# Patient Record
Sex: Female | Born: 1990 | Race: Black or African American | Hispanic: No | Marital: Single | Smoking: Current every day smoker
Health system: Southern US, Community
[De-identification: ages and names within clinical notes are randomized; demographics above are authoritative.]

---
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
## 2010-01-20 ENCOUNTER — Inpatient Hospital Stay (HOSPITAL_COMMUNITY): Admission: AD | Admit: 2010-01-20 | Discharge: 2010-01-20 | Payer: Self-pay | Admitting: Obstetrics

## 2010-04-06 ENCOUNTER — Inpatient Hospital Stay (HOSPITAL_COMMUNITY): Admission: AD | Admit: 2010-04-06 | Discharge: 2010-04-06 | Payer: Self-pay | Admitting: Obstetrics

## 2010-04-06 ENCOUNTER — Ambulatory Visit: Payer: Self-pay | Admitting: Obstetrics & Gynecology

## 2010-04-10 ENCOUNTER — Inpatient Hospital Stay (HOSPITAL_COMMUNITY): Admission: RE | Admit: 2010-04-10 | Discharge: 2010-04-12 | Payer: Self-pay | Admitting: Obstetrics

## 2010-12-11 ENCOUNTER — Inpatient Hospital Stay (HOSPITAL_COMMUNITY)
Admission: AD | Admit: 2010-12-11 | Discharge: 2010-12-12 | Discharge status: Against Medical Advice | Payer: Self-pay | Source: Ambulatory Visit | Attending: Obstetrics | Admitting: Obstetrics

## 2010-12-11 DIAGNOSIS — R109 Unspecified abdominal pain: Secondary | ICD-10-CM | POA: Insufficient documentation

## 2010-12-11 LAB — POCT PREGNANCY, URINE: Preg Test, Ur: NEGATIVE

## 2010-12-17 LAB — CBC
HCT: 25 % — ABNORMAL LOW (ref 36.0–46.0)
HCT: 33.4 % — ABNORMAL LOW (ref 36.0–46.0)
Hemoglobin: 11.5 g/dL — ABNORMAL LOW (ref 12.0–15.0)
Hemoglobin: 8.9 g/dL — ABNORMAL LOW (ref 12.0–15.0)
MCH: 30.1 pg (ref 26.0–34.0)
MCH: 30.9 pg (ref 26.0–34.0)
MCHC: 34.4 g/dL (ref 30.0–36.0)
MCHC: 35.5 g/dL (ref 30.0–36.0)
MCV: 87 fL (ref 78.0–100.0)
MCV: 87.6 fL (ref 78.0–100.0)
Platelets: 197 10*3/uL (ref 150–400)
Platelets: 311 10*3/uL (ref 150–400)
RBC: 2.88 MIL/uL — ABNORMAL LOW (ref 3.87–5.11)
RBC: 3.81 MIL/uL — ABNORMAL LOW (ref 3.87–5.11)
RDW: 18.5 % — ABNORMAL HIGH (ref 11.5–15.5)
RDW: 19 % — ABNORMAL HIGH (ref 11.5–15.5)
WBC: 12 10*3/uL — ABNORMAL HIGH (ref 4.0–10.5)
WBC: 14.4 10*3/uL — ABNORMAL HIGH (ref 4.0–10.5)

## 2010-12-17 LAB — URINALYSIS, ROUTINE W REFLEX MICROSCOPIC
Bilirubin Urine: NEGATIVE
Glucose, UA: NEGATIVE mg/dL
Hgb urine dipstick: NEGATIVE
Ketones, ur: NEGATIVE mg/dL
Nitrite: NEGATIVE
Protein, ur: NEGATIVE mg/dL
Specific Gravity, Urine: >1.03 — ABNORMAL HIGH (ref 1.005–1.030)
Urobilinogen, UA: 0.2 mg/dL (ref 0.0–1.0)
pH: 6 (ref 5.0–8.0)

## 2010-12-17 LAB — URINE CULTURE
Colony Count: NO GROWTH
Culture: NO GROWTH

## 2010-12-17 LAB — RPR: RPR Ser Ql: NONREACTIVE

## 2010-12-17 LAB — URINE MICROSCOPIC-ADD ON

## 2010-12-17 LAB — CCBB MATERNAL DONOR DRAW

## 2010-12-19 LAB — URINALYSIS, ROUTINE W REFLEX MICROSCOPIC
Bilirubin Urine: NEGATIVE
Glucose, UA: NEGATIVE mg/dL
Hgb urine dipstick: NEGATIVE
Ketones, ur: NEGATIVE mg/dL
Nitrite: NEGATIVE
Protein, ur: NEGATIVE mg/dL
Specific Gravity, Urine: >1.03 — ABNORMAL HIGH (ref 1.005–1.030)
Urobilinogen, UA: 0.2 mg/dL (ref 0.0–1.0)
pH: 6.5 (ref 5.0–8.0)

## 2010-12-20 LAB — URINE MICROSCOPIC-ADD ON

## 2010-12-20 LAB — URINALYSIS, ROUTINE W REFLEX MICROSCOPIC
Bilirubin Urine: NEGATIVE
Glucose, UA: NEGATIVE mg/dL
Hgb urine dipstick: NEGATIVE
Ketones, ur: NEGATIVE mg/dL
Nitrite: NEGATIVE
Protein, ur: NEGATIVE mg/dL
Specific Gravity, Urine: 1.025 (ref 1.005–1.030)
Urobilinogen, UA: 0.2 mg/dL (ref 0.0–1.0)
pH: 6.5 (ref 5.0–8.0)

## 2010-12-20 LAB — URINE CULTURE
Colony Count: NO GROWTH
Culture: NO GROWTH

## 2011-01-01 LAB — WET PREP, GENITAL
Clue Cells Wet Prep HPF POC: NONE SEEN
Trich, Wet Prep: NONE SEEN
Yeast Wet Prep HPF POC: NONE SEEN

## 2011-01-01 LAB — CBC
HCT: 35.6 % — ABNORMAL LOW (ref 36.0–46.0)
Hemoglobin: 12.6 g/dL (ref 12.0–15.0)
MCHC: 35.4 g/dL (ref 30.0–36.0)
MCV: 90.6 fL (ref 78.0–100.0)
Platelets: 259 10*3/uL (ref 150–400)
RBC: 3.93 MIL/uL (ref 3.87–5.11)
RDW: 16.4 % — ABNORMAL HIGH (ref 11.5–15.5)
WBC: 14.5 10*3/uL — ABNORMAL HIGH (ref 4.0–10.5)

## 2011-01-01 LAB — GC/CHLAMYDIA PROBE AMP, GENITAL
Chlamydia, DNA Probe: NEGATIVE
GC Probe Amp, Genital: NEGATIVE

## 2011-01-01 LAB — DIFFERENTIAL
Basophils Absolute: 0 10*3/uL (ref 0.0–0.1)
Basophils Relative: 0 % (ref 0–1)
Eosinophils Absolute: 0.6 10*3/uL (ref 0.0–0.7)
Eosinophils Relative: 4 % (ref 0–5)
Lymphocytes Relative: 11 % — ABNORMAL LOW (ref 12–46)
Lymphs Abs: 1.6 10*3/uL (ref 0.7–4.0)
Monocytes Absolute: 0.8 10*3/uL (ref 0.1–1.0)
Monocytes Relative: 6 % (ref 3–12)
Neutro Abs: 11.5 10*3/uL — ABNORMAL HIGH (ref 1.7–7.7)
Neutrophils Relative %: 79 % — ABNORMAL HIGH (ref 43–77)

## 2011-01-03 LAB — CBC
MCHC: 35.7 g/dL (ref 30.0–36.0)
MCV: 89.7 fL (ref 78.0–100.0)
RBC: 3.9 MIL/uL (ref 3.87–5.11)
RDW: 16.2 % — ABNORMAL HIGH (ref 11.5–15.5)

## 2011-01-03 LAB — URINALYSIS, ROUTINE W REFLEX MICROSCOPIC
Bilirubin Urine: NEGATIVE
Glucose, UA: NEGATIVE mg/dL
Ketones, ur: NEGATIVE mg/dL
Leukocytes, UA: NEGATIVE
Nitrite: NEGATIVE
Protein, ur: NEGATIVE mg/dL
Specific Gravity, Urine: 1.031 — ABNORMAL HIGH (ref 1.005–1.030)
Urobilinogen, UA: 1 mg/dL (ref 0.0–1.0)
pH: 6.5 (ref 5.0–8.0)

## 2011-01-03 LAB — POCT I-STAT, CHEM 8
BUN: 6 mg/dL (ref 6–23)
Creatinine, Ser: 0.6 mg/dL (ref 0.4–1.2)
Potassium: 3.7 mEq/L (ref 3.5–5.1)
Sodium: 139 mEq/L (ref 135–145)
TCO2: 22 mmol/L (ref 0–100)

## 2011-01-03 LAB — URINE MICROSCOPIC-ADD ON

## 2011-01-03 LAB — GC/CHLAMYDIA PROBE AMP, GENITAL: Chlamydia, DNA Probe: NEGATIVE

## 2011-01-03 LAB — POCT PREGNANCY, URINE: Preg Test, Ur: POSITIVE

## 2011-01-03 LAB — DIFFERENTIAL
Basophils Relative: 0 % (ref 0–1)
Eosinophils Absolute: 0.2 10*3/uL (ref 0.0–0.7)
Monocytes Relative: 5 % (ref 3–12)
Neutrophils Relative %: 76 % (ref 43–77)

## 2011-01-03 LAB — WET PREP, GENITAL: Yeast Wet Prep HPF POC: NONE SEEN

## 2011-01-16 LAB — URINALYSIS, ROUTINE W REFLEX MICROSCOPIC
Bilirubin Urine: NEGATIVE
Hgb urine dipstick: NEGATIVE
Nitrite: NEGATIVE
Protein, ur: NEGATIVE mg/dL
Specific Gravity, Urine: 1.027 (ref 1.005–1.030)
Urobilinogen, UA: 1 mg/dL (ref 0.0–1.0)
pH: 7 (ref 5.0–8.0)

## 2011-01-16 LAB — URINE MICROSCOPIC-ADD ON

## 2011-02-16 NOTE — Op Note (Signed)
   NAMESULEMA, Christine Horne                        ACCOUNT NO.:  [id number]   MEDICAL RECORD NO.:  [id number]                   PATIENT TYPE:  AMB   LOCATION:  DSC                                  FACILITY:  [facility]   PHYSICIAN:  Prabhakar D. Pendse, M.D.           DATE OF BIRTH:  05/01/1991   DATE OF PROCEDURE:  07/08/2003  DATE OF DISCHARGE:                                 OPERATIVE REPORT   PREOPERATIVE DIAGNOSIS:  1. Left inguinal hernia.  2. Possible right inguinal hernia.   POSTOPERATIVE DIAGNOSIS:  Bilateral indirect inguinal herniae.   OPERATION PERFORMED:  Repair of bilateral indirect inguinal herniae.   SURGEON:  Prabhakar D. Levie Heritage, M.D.   ASSISTANT:  Nurse   ANESTHESIA:  Nurse.   OPERATIVE PROCEDURE:  Under satisfactory general anesthesia, the patient in  supine position, the abdomen was sterilely prepped and draped in the usual  manner.  An about 5 cm long transverse incision was made in the distal skin  crease.  The skin and subcutaneous tissue were incised.  Bleeders were  individually clamped, cut, and electrocoagulated.  The external oblique was  opened.  The round ligament together with the hernia sac were isolated,  clamped, and doubly suture ligated with excess of the sac excised.  Hernia  repair was carried out with modified Ferguson method with 4-0 steel wire  interrupted sutures.  0.25% Marcaine with epinephrine was injected locally  for postop analgesia.  The subcutaneous tissue were opposed with 3-0 Vicryl  and the skin was closed with 5-0 Monocryl subcuticular sutures.  Since the  patient's general condition was satisfactory, exploration of the right groin  was carried out with findings consistent with right indirect inguinal  hernia.  Repair was carried out in a similar fashion.  Both incisions were  dressed with Steri-Strips.  Throughout the procedure, the patient's vital  signs remained stable.  The patient withstood the procedure well and was  transferred to the recovery room in satisfactory general condition.                                               Prabhakar D. Levie Heritage, M.D.    PDP/MEDQ  D:  07/08/2003  T:  07/08/2003  Job:  [id number]   cc:   Fanny Dance. Rankins, M.D.  [address]  [place]  [place] [address]  Fax: [phone]

## 2013-01-29 ENCOUNTER — Encounter (HOSPITAL_COMMUNITY): Payer: Self-pay | Admitting: Emergency Medicine

## 2013-01-29 ENCOUNTER — Emergency Department (HOSPITAL_COMMUNITY)
Admission: EM | Admit: 2013-01-29 | Discharge: 2013-01-29 | Disposition: A | Discharge status: Home / Self Care | Payer: Medicaid Other | Source: Home / Self Care | Attending: Family Medicine | Admitting: Family Medicine

## 2013-01-29 DIAGNOSIS — J309 Allergic rhinitis, unspecified: Secondary | ICD-10-CM

## 2013-01-29 LAB — POCT RAPID STREP A: Streptococcus, Group A Screen (Direct): NEGATIVE

## 2013-01-29 MED ORDER — LORATADINE 10 MG PO TABS: 10.0000 mg | ORAL_TABLET | Freq: Every day | ORAL | Status: DC

## 2013-01-29 NOTE — ED Notes (Signed)
Discharge delay secondary this nurse assignment/necessary resources

## 2013-01-29 NOTE — ED Provider Notes (Signed)
History     CSN: [id number]  Arrival date & time 01/29/13  1001   First MD Initiated Contact with Patient 01/29/13 1006      Chief Complaint  Patient presents with  . Sore Throat    (Consider location/radiation/quality/duration/timing/severity/associated sxs/prior treatment) HPI Comments: Pt is worried she has strep, wants to be tested. Denies exposure to strep  Patient is a 22 y.o. female presenting with pharyngitis. The history is provided by the patient.  Sore Throat This is a new problem. Episode onset: one week ago. The problem occurs constantly. The problem has not changed since onset.Pertinent negatives include no abdominal pain and no headaches. The symptoms are aggravated by swallowing. Nothing relieves the symptoms. She has tried nothing for the symptoms.    History reviewed. No pertinent past medical history.  Past Surgical History  Procedure Laterality Date  . Hernia repair  2007    No family history on file.  History  Substance Use Topics  . Smoking status: Current Every Day Smoker  . Smokeless tobacco: Not on file  . Alcohol Use: No    OB History   Grav Para Term Preterm Abortions TAB SAB Ect Mult Living                  Review of Systems  Constitutional: Negative for fever and chills.  HENT: Positive for sore throat. Negative for ear pain, congestion, rhinorrhea, trouble swallowing, postnasal drip and sinus pressure.   Respiratory: Negative for cough.   Gastrointestinal: Negative for abdominal pain.  Neurological: Negative for headaches.    Allergies  Review of patient's allergies indicates no known allergies.  Home Medications   Current Outpatient Rx  Name  Route  Sig  Dispense  Refill  . loratadine (CLARITIN) 10 MG tablet   Oral   Take 1 tablet (10 mg total) by mouth daily.   30 tablet   2     BP 115/91  Pulse 111  Temp(Src) 98 F (36.7 C) (Oral)  Resp 12  SpO2 97%  LMP 01/14/2013  Physical Exam  Constitutional: She appears  well-developed and well-nourished. She does not appear ill. No distress.  HENT:  Right Ear: Tympanic membrane, external ear and ear canal normal.  Left Ear: Tympanic membrane, external ear and ear canal normal.  Nose: Mucosal edema present. No rhinorrhea. Right sinus exhibits no maxillary sinus tenderness and no frontal sinus tenderness. Left sinus exhibits no maxillary sinus tenderness and no frontal sinus tenderness.  Mouth/Throat: Oropharynx is clear and moist and mucous membranes are normal.  Cardiovascular: Normal rate and regular rhythm.   Pulmonary/Chest: Effort normal and breath sounds normal.  Lymphadenopathy:       Head (right side): No submental, no submandibular and no tonsillar adenopathy present.       Head (left side): No submental, no submandibular and no tonsillar adenopathy present.    She has no cervical adenopathy.    ED Course  Procedures (including critical care time)  Labs Reviewed  POCT RAPID STREP A ([facility] CARE ONLY)   No results found.   1. Allergic rhinitis       MDM  Strep is negative, pt does not appear ill nor does she feel ill.  Most likely throat irritation from post nasal drainage; considering season will try tx with claritin 10mg  daily #30 2 refills        Cathlyn Parsons, NP 01/29/13 1151

## 2013-01-29 NOTE — ED Notes (Signed)
Patient not in treatment room 

## 2013-01-29 NOTE — ED Notes (Signed)
Reports sore throat since Thursday.  No one sick at home.  Denies fever.  Denies runny nose, denies cough, denies itchy eyes.

## 2013-01-29 NOTE — ED Provider Notes (Signed)
Medical screening examination/treatment/procedure(s) were performed by non-physician practitioner and as supervising physician I was immediately available for consultation/collaboration.   [facility]; MD  Sharin Grave, MD 01/29/13 [phone]

## 2018-10-28 ENCOUNTER — Emergency Department (HOSPITAL_COMMUNITY): Admission: EM | Admit: 2018-10-28 | Discharge: 2018-10-28 | Discharge status: Against Medical Advice | Payer: Self-pay

## 2018-10-29 ENCOUNTER — Encounter (HOSPITAL_COMMUNITY): Payer: Self-pay | Admitting: Emergency Medicine

## 2018-10-29 ENCOUNTER — Observation Stay (HOSPITAL_COMMUNITY): Payer: Medicaid Other

## 2018-10-29 ENCOUNTER — Observation Stay (HOSPITAL_COMMUNITY)
Admission: EM | Admit: 2018-10-29 | Discharge: 2018-10-30 | Disposition: A | Discharge status: Home / Self Care | Payer: Medicaid Other | Attending: Emergency Medicine | Admitting: Emergency Medicine

## 2018-10-29 ENCOUNTER — Other Ambulatory Visit: Payer: Self-pay

## 2018-10-29 DIAGNOSIS — T8149XA Infection following a procedure, other surgical site, initial encounter: Secondary | ICD-10-CM

## 2018-10-29 DIAGNOSIS — X58XXXA Exposure to other specified factors, initial encounter: Secondary | ICD-10-CM | POA: Diagnosis not present

## 2018-10-29 DIAGNOSIS — T8141XA Infection following a procedure, superficial incisional surgical site, initial encounter: Principal | ICD-10-CM | POA: Insufficient documentation

## 2018-10-29 DIAGNOSIS — F17200 Nicotine dependence, unspecified, uncomplicated: Secondary | ICD-10-CM | POA: Insufficient documentation

## 2018-10-29 DIAGNOSIS — T8140XA Infection following a procedure, unspecified, initial encounter: Secondary | ICD-10-CM | POA: Diagnosis present

## 2018-10-29 LAB — CBC WITH DIFFERENTIAL/PLATELET
ABS IMMATURE GRANULOCYTES: 0.07 10*3/uL (ref 0.00–0.07)
Basophils Absolute: 0 10*3/uL (ref 0.0–0.1)
Basophils Relative: 0 %
EOS PCT: 2 %
Eosinophils Absolute: 0.1 10*3/uL (ref 0.0–0.5)
HEMATOCRIT: 27.5 % — AB (ref 36.0–46.0)
Hemoglobin: 8.4 g/dL — ABNORMAL LOW (ref 12.0–15.0)
Immature Granulocytes: 1 %
LYMPHS PCT: 18 %
Lymphs Abs: 1.4 10*3/uL (ref 0.7–4.0)
MCH: 28.3 pg (ref 26.0–34.0)
MCHC: 30.5 g/dL (ref 30.0–36.0)
MCV: 92.6 fL (ref 80.0–100.0)
MONO ABS: 0.5 10*3/uL (ref 0.1–1.0)
MONOS PCT: 6 %
NEUTROS ABS: 5.7 10*3/uL (ref 1.7–7.7)
Neutrophils Relative %: 73 %
Platelets: 305 10*3/uL (ref 150–400)
RBC: 2.97 MIL/uL — ABNORMAL LOW (ref 3.87–5.11)
RDW: 22 % — AB (ref 11.5–15.5)
WBC: 7.8 10*3/uL (ref 4.0–10.5)
nRBC: 0 % (ref 0.0–0.2)

## 2018-10-29 LAB — BASIC METABOLIC PANEL
Anion gap: 7 (ref 5–15)
BUN: 9 mg/dL (ref 6–20)
CHLORIDE: 104 mmol/L (ref 98–111)
CO2: 25 mmol/L (ref 22–32)
CREATININE: 0.92 mg/dL (ref 0.44–1.00)
Calcium: 9 mg/dL (ref 8.9–10.3)
GFR calc Af Amer: >60 mL/min (ref >60)
GFR calc non Af Amer: >60 mL/min (ref >60)
Glucose, Bld: 102 mg/dL — ABNORMAL HIGH (ref 70–99)
POTASSIUM: 4 mmol/L (ref 3.5–5.1)
SODIUM: 136 mmol/L (ref 135–145)

## 2018-10-29 LAB — I-STAT BETA HCG BLOOD, ED (MC, WL, AP ONLY): I-stat hCG, quantitative: <5 m[IU]/mL (ref <5)

## 2018-10-29 MED ORDER — ONDANSETRON HCL 4 MG/2ML IJ SOLN: 4.0000 mg | Freq: Four times a day (QID) | INTRAMUSCULAR | Status: DC | PRN

## 2018-10-29 MED ORDER — METHOCARBAMOL 500 MG PO TABS: 500.0000 mg | ORAL_TABLET | Freq: Four times a day (QID) | ORAL | Status: DC | PRN

## 2018-10-29 MED ORDER — ENOXAPARIN SODIUM 40 MG/0.4ML ~~LOC~~ SOLN
40.0000 mg | SUBCUTANEOUS | Status: DC
  Filled 2018-10-29: qty 0.4

## 2018-10-29 MED ORDER — DIPHENHYDRAMINE HCL 25 MG PO CAPS: 25.0000 mg | ORAL_CAPSULE | Freq: Four times a day (QID) | ORAL | Status: DC | PRN

## 2018-10-29 MED ORDER — FERROUS SULFATE 325 (65 FE) MG PO TABS: 650.0000 mg | ORAL_TABLET | Freq: Every day | ORAL | Status: DC

## 2018-10-29 MED ORDER — ACETAMINOPHEN 325 MG PO TABS: 650.0000 mg | ORAL_TABLET | Freq: Four times a day (QID) | ORAL | Status: DC | PRN

## 2018-10-29 MED ORDER — SIMETHICONE 80 MG PO CHEW: 40.0000 mg | CHEWABLE_TABLET | Freq: Four times a day (QID) | ORAL | Status: DC | PRN

## 2018-10-29 MED ORDER — IOPAMIDOL (ISOVUE-300) INJECTION 61%
100.0000 mL | Freq: Once | INTRAVENOUS | Status: AC | PRN
  Administered 2018-10-29: 100 mL via INTRAVENOUS

## 2018-10-29 MED ORDER — PRENATAL MULTIVITAMIN CH
1.0000 | ORAL_TABLET | Freq: Every day | ORAL | Status: DC
  Administered 2018-10-29: 1 via ORAL
  Filled 2018-10-29 (×2): qty 1

## 2018-10-29 MED ORDER — DOCUSATE SODIUM 100 MG PO CAPS
100.0000 mg | ORAL_CAPSULE | Freq: Two times a day (BID) | ORAL | Status: DC
  Administered 2018-10-29: 100 mg via ORAL
  Filled 2018-10-29: qty 1

## 2018-10-29 MED ORDER — OXYCODONE HCL 5 MG PO TABS: 5.0000 mg | ORAL_TABLET | ORAL | Status: DC | PRN

## 2018-10-29 MED ORDER — MORPHINE SULFATE (PF) 2 MG/ML IV SOLN: 1.0000 mg | INTRAVENOUS | Status: DC | PRN

## 2018-10-29 MED ORDER — SODIUM CHLORIDE (PF) 0.9 % IJ SOLN
INTRAMUSCULAR | Status: AC
  Filled 2018-10-29: qty 50

## 2018-10-29 MED ORDER — IOPAMIDOL (ISOVUE-300) INJECTION 61%
INTRAVENOUS | Status: AC
  Filled 2018-10-29: qty 100

## 2018-10-29 MED ORDER — DIPHENHYDRAMINE HCL 50 MG/ML IJ SOLN: 25.0000 mg | Freq: Four times a day (QID) | INTRAMUSCULAR | Status: DC | PRN

## 2018-10-29 MED ORDER — ONDANSETRON 4 MG PO TBDP: 4.0000 mg | ORAL_TABLET | Freq: Four times a day (QID) | ORAL | Status: DC | PRN

## 2018-10-29 MED ORDER — ACETAMINOPHEN 650 MG RE SUPP: 650.0000 mg | Freq: Four times a day (QID) | RECTAL | Status: DC | PRN

## 2018-10-29 MED ORDER — PIPERACILLIN-TAZOBACTAM 3.375 G IVPB
3.3750 g | Freq: Three times a day (TID) | INTRAVENOUS | Status: DC
  Administered 2018-10-29 – 2018-10-30 (×3): 3.375 g via INTRAVENOUS
  Filled 2018-10-29 (×3): qty 50

## 2018-10-29 MED ORDER — SODIUM CHLORIDE 0.9 % IV SOLN
INTRAVENOUS | Status: DC
  Administered 2018-10-29 (×2): via INTRAVENOUS

## 2018-10-29 MED ORDER — METOPROLOL TARTRATE 5 MG/5ML IV SOLN: 5.0000 mg | Freq: Four times a day (QID) | INTRAVENOUS | Status: DC | PRN

## 2018-10-29 NOTE — H&P (Addendum)
[facility] Admission Note  Christine Horne Oct 12, 1990  [id number].    Requesting MD: Benjiman Core Chief Complaint/Reason for Consult: possible infection after brazilian butt lift  HPI:  Christine Horne is a 28yo female with no significant PMH who presented to [facility] earlier today complaining of pain in her buttocks. States that she went to [place] and had a [place] butt lift on 10/14/2018 by Dr. Judithann Sauger. Discharged on unknown antibiotic that she took BID x10 days for, no follow up scheduled. States that she has been sore since surgery, on but on 1/24 she noticed increased pain and foul smelling drainage in her buttocks. Pain is constant and severe, worse with sitting. Denies fever, chills, nausea, vomiting.  Smokes hookah. Not on anticoagulation. Works as a Sales executive.  Last meal was sausage/egg/cheese biscuit at ~8am this AM.   ROS: Review of Systems  Constitutional: Negative for chills and fever.  HENT: Negative.   Eyes: Negative.   Respiratory: Negative.   Cardiovascular: Negative.   Gastrointestinal: Negative.  Negative for abdominal pain, nausea and vomiting.  Genitourinary: Negative.  Negative for dysuria.  Musculoskeletal:       Buttock pain  Skin: Negative.   Neurological: Negative.    All systems reviewed and otherwise negative except for as above  No family history on file.  History reviewed. No pertinent past medical history.  Past Surgical History:  Procedure Laterality Date  . HERNIA REPAIR  2007    Social History:  reports that she has been smoking. She has never used smokeless tobacco. She reports current alcohol use. She reports that she does not use drugs.  Allergies: No Known Allergies  (Not in a hospital admission)   Prior to Admission medications   Medication Sig Start Date End Date Taking? Authorizing Provider  ferrous sulfate 325 (65 FE) MG tablet Take 650 mg by mouth daily with breakfast.   Yes [provider]    Prenatal Vit-Fe Fumarate-FA (MULTIVITAMIN-PRENATAL) 27-0.8 MG TABS tablet Take 1 tablet by mouth daily at 12 noon.   Yes [provider]  loratadine (CLARITIN) 10 MG tablet Take 1 tablet (10 mg total) by mouth daily. Patient not taking: Reported on 10/29/2018 01/29/13   Cathlyn Parsons, NP    Blood pressure 131/78, pulse 87, temperature 98 F (36.7 C), temperature source Oral, resp. rate 14, height 5\' 7"  (1.702 m), weight 99.8 kg, SpO2 100 %. Physical Exam: General: pleasant, WD/WN AA female who is laying in bed in NAD HEENT: head is normocephalic, atraumatic.  Sclera are noninjected.  Pupils equal and round.  Ears and nose without any masses or lesions.  Mouth is pink and moist. Dentition fair Heart: regular, rate, and rhythm.  No obvious murmurs, gallops, or rubs noted.  Palpable pedal pulses bilaterally Lungs: CTAB, no wheezes, rhonchi, or rales noted.  Respiratory effort nonlabored Abd: soft, NT/ND, +BS, no masses, hernias, or organomegaly MS: all 4 extremities are symmetrical with no cyanosis, clubbing, or edema. Psych: A&Ox3 with an appropriate affect. Neuro: cranial nerves grossly intact, extremity CSM intact bilaterally, normal speech   Physical Exam  Skin:     port sites noted on mid-back x2, lower back x2, and at coccyx x1 cdi without erythema or drainage. Gluts with induration and purulent drainage bilaterally     No results found for this or any previous visit (from the past 48 hour(s)). No results found.    Assessment/Plan  S/p [place] butt lift 10/14/2018 in [place] with infection - Purulent drainage  noted from buttocks. Will obtain CT scan to evaluate for possible deeper infection. Will admit for antibiotics. Depending on CT scan may need debridement in the OR. She did eat a big breakfast so if any surgery is needed this will be planned for tomorrow. Labs are pending.  ID - zosyn VTE - SCDs, lovenox FEN - IVF, NPO Foley - none Follow up -  TBD   Franne Forts, [facility] 10/29/2018, 10:58 AM Pager: [phone] Mon-Thurs 7:00 am-4:30 pm Fri 7:00 am -11:30 AM Sat-Sun 7:00 am-11:30 am

## 2018-10-29 NOTE — ED Notes (Signed)
ED TO INPATIENT HANDOFF REPORT  Name/Age/Gender Christine Horne 28 y.o. female  Code Status    Code Status Orders  (From admission, onward)         Start     Ordered   10/29/18 1120  Full code  Continuous     10/29/18 1121        Code Status History    This patient has a current code status but no historical code status.      Home/SNF/Other Home  Chief Complaint post op infection  Level of Care/Admitting Diagnosis ED Disposition    ED Disposition Condition Comment   Admit  Hospital Area: [facility] [[id number]]  Level of Care: Med-Surg [16]  Diagnosis: Postoperative infection [161096]  Admitting Physician: CCS, MD [[id number]]  Attending Physician: CCS, MD [[id number]]  Bed request comments: 5 west  PT Class (Do Not Modify): Observation [104]  PT Acc Code (Do Not Modify): Observation [10022]       Medical History History reviewed. No pertinent past medical history.  Allergies No Known Allergies  IV Location/Drains/Wounds Patient Lines/Drains/Airways Status   Active Line/Drains/Airways    None          Labs/Imaging Results for orders placed or performed during the hospital encounter of 10/29/18 (from the past 48 hour(s))  Basic metabolic panel     Status: Abnormal   Collection Time: 10/29/18 11:00 AM  Result Value Ref Range   Sodium 136 135 - 145 mmol/L   Potassium 4.0 3.5 - 5.1 mmol/L   Chloride 104 98 - 111 mmol/L   CO2 25 22 - 32 mmol/L   Glucose, Bld 102 (H) 70 - 99 mg/dL   BUN 9 6 - 20 mg/dL   Creatinine, Ser 0.45 0.44 - 1.00 mg/dL   Calcium 9.0 8.9 - 40.9 mg/dL   GFR calc non Af Amer >60 >60 mL/min   GFR calc Af Amer >60 >60 mL/min   Anion gap 7 5 - 15    Comment: Performed at [facility], [address] [place], [place] [address]  CBC with Differential     Status: Abnormal   Collection Time: 10/29/18 11:00 AM  Result Value Ref Range   WBC 7.8 4.0 - 10.5 K/uL   RBC 2.97 (L) 3.87 - 5.11 MIL/uL    Hemoglobin 8.4 (L) 12.0 - 15.0 g/dL   HCT 47.8 (L) 29.5 - 62.1 %   MCV 92.6 80.0 - 100.0 fL   MCH 28.3 26.0 - 34.0 pg   MCHC 30.5 30.0 - 36.0 g/dL   RDW 30.8 (H) 65.7 - 84.6 %   Platelets 305 150 - 400 K/uL   nRBC 0.0 0.0 - 0.2 %   Neutrophils Relative % 73 %   Neutro Abs 5.7 1.7 - 7.7 K/uL   Lymphocytes Relative 18 %   Lymphs Abs 1.4 0.7 - 4.0 K/uL   Monocytes Relative 6 %   Monocytes Absolute 0.5 0.1 - 1.0 K/uL   Eosinophils Relative 2 %   Eosinophils Absolute 0.1 0.0 - 0.5 K/uL   Basophils Relative 0 %   Basophils Absolute 0.0 0.0 - 0.1 K/uL   Immature Granulocytes 1 %   Abs Immature Granulocytes 0.07 0.00 - 0.07 K/uL   Polychromasia PRESENT    Ovalocytes PRESENT     Comment: Performed at [facility], [address] [place], [place] [address]  I-Stat beta hCG blood, ED     Status: None   Collection Time:  10/29/18 11:25 AM  Result Value Ref Range   I-stat hCG, quantitative <5.0 <5 mIU/mL   Comment 3            Comment:   GEST. AGE      CONC.  (mIU/mL)   <=1 WEEK        5 - 50     2 WEEKS       50 - 500     3 WEEKS       100 - 10,000     4 WEEKS     1,000 - 30,000        FEMALE AND NON-PREGNANT FEMALE:     LESS THAN 5 mIU/mL    Ct Pelvis W Contrast  Result Date: 10/29/2018 CLINICAL DATA:  Recent plastic surgery on the buttocks 2 weeks ago with buttock pain and rectal pain, initial encounter EXAM: CT PELVIS WITH CONTRAST TECHNIQUE: Multidetector CT imaging of the pelvis was performed using the standard protocol following the bolus administration of intravenous contrast. CONTRAST:  ISOVUE-300 COMPARISON:  None. FINDINGS: Urinary Tract:  Bladder is well distended. Bowel: Visualized bowel is noted. No perirectal or perianal fluid collection is identified. In the superior aspect of the intergluteal cleft, there is a small focus of air and soft tissue density identified. This is of uncertain significance but has the appearance of fecal material. Direct  visualization is recommended. This is best visualized on the axial image 123 of series 3 as well as the coronal images (128 of series 8) and the sagittal images (120 of series 9). No associated fluid collection is seen. Vascular/Lymphatic: No pathologically enlarged lymph nodes. No significant vascular abnormality seen. Reproductive: Uterus is within normal limits. Cystic changes are noted in the ovaries bilaterally. Other: No free fluid is seen. Considerable subcutaneous edema is identified along the anterior aspect of the lower abdomen and pelvis as well as laterally and extending posteriorly likely related to the recent surgery. No focal fluid collection to suggest abscess is identified. Musculoskeletal: No bony abnormality is noted. IMPRESSION: Changes consistent with the recent surgery with considerable subcutaneous edema identified anteriorly and extending along the lateral and posterior aspect of the abdominal and pelvic wall. No definitive focal fluid collection to suggest abscess is seen. Mottled density identified in the superior aspect of the intergluteal cleft. This has the appearance of fecal material. Direct visualization is recommended. No associated fluid collection to suggest abscess is seen. No other focal abnormality is noted. Electronically Signed   By: Alcide Clever M.D.   On: 10/29/2018 12:56    Pending Labs Unresulted Labs (From admission, onward)    Start     Ordered   11/05/18 0500  Creatinine, serum  (enoxaparin (LOVENOX)    CrCl >/= 30 ml/min)  Weekly,   R    Comments:  while on enoxaparin therapy    10/29/18 1121   10/30/18 0500  Basic metabolic panel  Tomorrow morning,   R     10/29/18 1121   10/30/18 0500  CBC  Tomorrow morning,   R     10/29/18 1121   10/29/18 1120  HIV antibody (Routine Testing)  Once,   R     10/29/18 1121          Vitals/Pain Today's Vitals   10/29/18 1045 10/29/18 1100 10/29/18 1115 10/29/18 1130  BP:  127/66  133/90  Pulse: 95 79 81 81   Resp:    20  Temp:  TempSrc:      SpO2: 100% 100% 100% 100%  Weight:      Height:      PainSc:        Isolation Precautions No active isolations  Medications Medications  enoxaparin (LOVENOX) injection 40 mg (has no administration in time range)  0.9 %  sodium chloride infusion (has no administration in time range)  piperacillin-tazobactam (ZOSYN) IVPB 3.375 g (has no administration in time range)  metoprolol tartrate (LOPRESSOR) injection 5 mg (has no administration in time range)  simethicone (MYLICON) chewable tablet 40 mg (has no administration in time range)  ondansetron (ZOFRAN-ODT) disintegrating tablet 4 mg (has no administration in time range)    Or  ondansetron (ZOFRAN) injection 4 mg (has no administration in time range)  docusate sodium (COLACE) capsule 100 mg (has no administration in time range)  diphenhydrAMINE (BENADRYL) capsule 25 mg (has no administration in time range)    Or  diphenhydrAMINE (BENADRYL) injection 25 mg (has no administration in time range)  methocarbamol (ROBAXIN) tablet 500 mg (has no administration in time range)  morphine 2 MG/ML injection 1 mg (has no administration in time range)  acetaminophen (TYLENOL) tablet 650 mg (has no administration in time range)    Or  acetaminophen (TYLENOL) suppository 650 mg (has no administration in time range)  oxyCODONE (Oxy IR/ROXICODONE) immediate release tablet 5 mg (has no administration in time range)  ferrous sulfate tablet 650 mg (has no administration in time range)  multivitamin-prenatal tablet 1 tablet (has no administration in time range)  iopamidol (ISOVUE-300) 61 % injection (has no administration in time range)  sodium chloride (PF) 0.9 % injection (has no administration in time range)  iopamidol (ISOVUE-300) 61 % injection 100 mL (100 mLs Intravenous Contrast Given 10/29/18 1200)    Mobility walks

## 2018-10-29 NOTE — ED Provider Notes (Signed)
[facility]-EMERGENCY DEPT Provider Note   CSN: [id number] Arrival date & time: 10/29/18  0825     History   Chief Complaint Chief Complaint  Patient presents with  . Post-op Problem    HPI Christine Horne is a 28 y.o. female.  HPI Patient is 14 days out from [place]  States for the last 4 days.  Has had increasing pain and drainage and at 1 of the areas there is a white thing.  States she called the surgeon sent a picture who thinks that could have been a infection or a gauze that was left there.  Had been on antibiotics up until a day ago.  Antibiotics however were just for regular postop care.  Has had chills without fevers.  Feeling fine otherwise.  Has had some purulent drainage. History reviewed. No pertinent past medical history.  Patient Active Problem List   Diagnosis Date Noted  . Postoperative infection 10/29/2018    Past Surgical History:  Procedure Laterality Date  . HERNIA REPAIR  2007     OB History   No obstetric history on file.      Home Medications    Prior to Admission medications   Medication Sig Start Date End Date Taking? Authorizing Provider  ferrous sulfate 325 (65 FE) MG tablet Take 650 mg by mouth daily with breakfast.   Yes [provider]  Prenatal Vit-Fe Fumarate-FA (MULTIVITAMIN-PRENATAL) 27-0.8 MG TABS tablet Take 1 tablet by mouth daily at 12 noon.   Yes [provider]  loratadine (CLARITIN) 10 MG tablet Take 1 tablet (10 mg total) by mouth daily. Patient not taking: Reported on 10/29/2018 01/29/13   Cathlyn ParsonsKabbe, Angela M, NP    Family History No family history on file.  Social History Social History   Tobacco Use  . Smoking status: Current Every Day Smoker  . Smokeless tobacco: Never Used  Substance Use Topics  . Alcohol use: Yes  . Drug use: No     Allergies   Patient has no known allergies.   Review of Systems Review of Systems  Constitutional: Positive  for chills. Negative for appetite change.  HENT: Negative for congestion.   Respiratory: Negative for shortness of breath.   Gastrointestinal: Negative for abdominal pain.  Genitourinary: Negative for flank pain.  Musculoskeletal: Negative for back pain.  Skin: Positive for wound.  Neurological: Negative for weakness.  Psychiatric/Behavioral: Negative for confusion.     Physical Exam Updated Vital Signs BP 131/78 (BP Location: Right Arm)   Pulse 87   Temp 98 F (36.7 C) (Oral)   Resp 14   Ht 5\' 7"  (1.702 m)   Wt 99.8 kg   SpO2 100%   BMI 34.46 kg/m   Physical Exam Neck:     Musculoskeletal: Neck supple.  Cardiovascular:     Rate and Rhythm: Regular rhythm.  Abdominal:     Tenderness: There is no abdominal tenderness.  Skin:    General: Skin is warm.     Capillary Refill: Capillary refill takes less than 2 seconds.     Comments: Buttock has postsurgical areas.  On the midline there is a surgical wound with some mild purulent drainage.  There is a central firm white area.  Tenderness over bilateral buttocks.  Neurological:     General: No focal deficit present.     Mental Status: She is alert.      ED Treatments / Results  Labs (all labs ordered  are listed, but only abnormal results are displayed) Labs Reviewed  CBC WITH DIFFERENTIAL/PLATELET - Abnormal; Notable for the following components:      Result Value   RBC 2.97 (*)    Hemoglobin 8.4 (*)    HCT 27.5 (*)    RDW 22.0 (*)    All other components within normal limits  BASIC METABOLIC PANEL  HIV ANTIBODY (ROUTINE TESTING W REFLEX)  I-STAT BETA HCG BLOOD, ED ([facility], [facility], [facility] ONLY)    EKG None  Radiology No results found.  Procedures Procedures (including critical care time)  Medications Ordered in ED Medications  enoxaparin (LOVENOX) injection 40 mg (has no administration in time range)  0.9 %  sodium chloride infusion (has no administration in time range)  piperacillin-tazobactam (ZOSYN) IVPB 3.375  g (has no administration in time range)  metoprolol tartrate (LOPRESSOR) injection 5 mg (has no administration in time range)  simethicone (MYLICON) chewable tablet 40 mg (has no administration in time range)  ondansetron (ZOFRAN-ODT) disintegrating tablet 4 mg (has no administration in time range)    Or  ondansetron (ZOFRAN) injection 4 mg (has no administration in time range)  docusate sodium (COLACE) capsule 100 mg (has no administration in time range)  diphenhydrAMINE (BENADRYL) capsule 25 mg (has no administration in time range)    Or  diphenhydrAMINE (BENADRYL) injection 25 mg (has no administration in time range)  methocarbamol (ROBAXIN) tablet 500 mg (has no administration in time range)  morphine 2 MG/ML injection 1 mg (has no administration in time range)  acetaminophen (TYLENOL) tablet 650 mg (has no administration in time range)    Or  acetaminophen (TYLENOL) suppository 650 mg (has no administration in time range)  oxyCODONE (Oxy IR/ROXICODONE) immediate release tablet 5 mg (has no administration in time range)  ferrous sulfate tablet 650 mg (has no administration in time range)  multivitamin-prenatal tablet 1 tablet (has no administration in time range)     Initial Impression / Assessment and Plan / ED Course  I have reviewed the triage vital signs and the nursing notes.  Pertinent labs & imaging results that were available during my care of the patient were reviewed by me and considered in my medical decision making (see chart for details).     Patient with infected Brazilian butt lift.  Has been on antibiotics.  Seen by general surgery admitted.  CT scan pending.  Final Clinical Impressions(s) / ED Diagnoses   Final diagnoses:  Wound infection after surgery    ED Discharge Orders    None       Benjiman Core, MD 10/29/18 1140

## 2018-10-29 NOTE — ED Triage Notes (Signed)
Pt reports she had brazilian butt lift in [place] on Jan 13. Reports drainage with foul odor since Friday. Finished last dose of antibiotics yesterday. C/o lots of pain and wouldn't sit during triage.

## 2018-10-30 LAB — CBC
HEMATOCRIT: 27.2 % — AB (ref 36.0–46.0)
Hemoglobin: 8.3 g/dL — ABNORMAL LOW (ref 12.0–15.0)
MCH: 28.6 pg (ref 26.0–34.0)
MCHC: 30.5 g/dL (ref 30.0–36.0)
MCV: 93.8 fL (ref 80.0–100.0)
Platelets: 260 10*3/uL (ref 150–400)
RBC: 2.9 MIL/uL — AB (ref 3.87–5.11)
RDW: 21.7 % — ABNORMAL HIGH (ref 11.5–15.5)
WBC: 6 10*3/uL (ref 4.0–10.5)
nRBC: 0 % (ref 0.0–0.2)

## 2018-10-30 LAB — BASIC METABOLIC PANEL
Anion gap: 7 (ref 5–15)
BUN: 9 mg/dL (ref 6–20)
CHLORIDE: 108 mmol/L (ref 98–111)
CO2: 21 mmol/L — ABNORMAL LOW (ref 22–32)
Calcium: 8.4 mg/dL — ABNORMAL LOW (ref 8.9–10.3)
Creatinine, Ser: 0.68 mg/dL (ref 0.44–1.00)
GFR calc Af Amer: >60 mL/min (ref >60)
GFR calc non Af Amer: >60 mL/min (ref >60)
Glucose, Bld: 111 mg/dL — ABNORMAL HIGH (ref 70–99)
Potassium: 4.4 mmol/L (ref 3.5–5.1)
Sodium: 136 mmol/L (ref 135–145)

## 2018-10-30 LAB — HIV ANTIBODY (ROUTINE TESTING W REFLEX): HIV SCREEN 4TH GENERATION: NONREACTIVE

## 2018-10-30 MED ORDER — OXYCODONE HCL 5 MG PO TABS: 5.0000 mg | ORAL_TABLET | Freq: Four times a day (QID) | ORAL | 0 refills | Status: AC | PRN

## 2018-10-30 MED ORDER — DOXYCYCLINE HYCLATE 50 MG PO CAPS: 50.0000 mg | ORAL_CAPSULE | Freq: Two times a day (BID) | ORAL | 0 refills | Status: AC

## 2018-10-30 MED ORDER — ACETAMINOPHEN 325 MG PO TABS: 650.0000 mg | ORAL_TABLET | Freq: Four times a day (QID) | ORAL | Status: AC | PRN

## 2018-10-30 NOTE — Discharge Summary (Signed)
[facility] Discharge Summary   Patient ID: Christine Horne MRN: [id number] DOB/AGE: 28-Nov-1990 28 y.o.  Admit date: 10/29/2018 Discharge date: 10/30/2018  Admitting Diagnosis: S/p Brazilian butt lift 10/14/2018 in [place] with infection  Discharge Diagnosis Patient Active Problem List   Diagnosis Date Noted  . Postoperative infection 10/29/2018    Consultants None  Imaging: Ct Pelvis W Contrast  Result Date: 10/29/2018 CLINICAL DATA:  Recent plastic surgery on the buttocks 2 weeks ago with buttock pain and rectal pain, initial encounter EXAM: CT PELVIS WITH CONTRAST TECHNIQUE: Multidetector CT imaging of the pelvis was performed using the standard protocol following the bolus administration of intravenous contrast. CONTRAST:  ISOVUE-300 COMPARISON:  None. FINDINGS: Urinary Tract:  Bladder is well distended. Bowel: Visualized bowel is noted. No perirectal or perianal fluid collection is identified. In the superior aspect of the intergluteal cleft, there is a small focus of air and soft tissue density identified. This is of uncertain significance but has the appearance of fecal material. Direct visualization is recommended. This is best visualized on the axial image 123 of series 3 as well as the coronal images (128 of series 8) and the sagittal images (120 of series 9). No associated fluid collection is seen. Vascular/Lymphatic: No pathologically enlarged lymph nodes. No significant vascular abnormality seen. Reproductive: Uterus is within normal limits. Cystic changes are noted in the ovaries bilaterally. Other: No free fluid is seen. Considerable subcutaneous edema is identified along the anterior aspect of the lower abdomen and pelvis as well as laterally and extending posteriorly likely related to the recent surgery. No focal fluid collection to suggest abscess is identified. Musculoskeletal: No bony abnormality is noted. IMPRESSION: Changes consistent with the recent  surgery with considerable subcutaneous edema identified anteriorly and extending along the lateral and posterior aspect of the abdominal and pelvic wall. No definitive focal fluid collection to suggest abscess is seen. Mottled density identified in the superior aspect of the intergluteal cleft. This has the appearance of fecal material. Direct visualization is recommended. No associated fluid collection to suggest abscess is seen. No other focal abnormality is noted. Electronically Signed   By: Alcide Clever M.D.   On: 10/29/2018 12:56    Procedures None  Hospital Course:  Christine Horne is a 28yo female who presented to [facility] 1/29 complaining of pain in her buttocks. States that she went to [place] and had a [place] butt lift on 10/14/2018 by Dr. Judithann Sauger.  On 1/24 she noticed increased pain and foul smelling drainage in her buttocks. Patient was admitted for IV antibiotics. Workup included CT scan which showed no diffuse cellulitis or drainable abscess/fluid collection.  She was advised to soak/sitz bath multiple times daily and keep the area clean. On 1/30 the patient was stable, afebrile, pain controlled, and felt stable for discharge home.  She will be discharged on 7 days of doxycycline. Continue good wound care. Follow up with PCP. Patient knows to call with questions or concerns.     Physical Exam: General:  Alert, NAD, pleasant, comfortable Pulm: effort normal, CTAB Cardio: RRR Abd:  Soft, ND, NT GU: port sites noted on mid-back x2, lower back x2, and at coccyx x1 cdi without erythema or drainage. Bilateral mid-buttocks with 68mm openings in skin with trace purulent drainage, no fluctuance, and piece of tissue/fat protruding from skin  Allergies as of 10/30/2018   No Known Allergies     Medication List    STOP taking these medications   loratadine 10 MG  tablet Commonly known as:  CLARITIN   sulfamethoxazole-trimethoprim 800-160 MG tablet Commonly known as:  BACTRIM DS,SEPTRA DS      TAKE these medications   acetaminophen 325 MG tablet Commonly known as:  TYLENOL Take 2 tablets (650 mg total) by mouth every 6 (six) hours as needed for mild pain (or temp > 100).   doxycycline 50 MG capsule Commonly known as:  VIBRAMYCIN Take 1 capsule (50 mg total) by mouth 2 (two) times daily for 7 days.   ferrous sulfate 325 (65 FE) MG tablet Take 650 mg by mouth daily with breakfast.   multivitamin-prenatal 27-0.8 MG Tabs tablet Take 1 tablet by mouth daily at 12 noon.   oxyCODONE 5 MG immediate release tablet Commonly known as:  Oxy IR/ROXICODONE Take 1 tablet (5 mg total) by mouth every 6 (six) hours as needed for severe pain.          Signed: Franne Forts, [facility] 10/30/2018, 10:02 AM Pager: [phone] Mon-Thurs 7:00 am-4:30 pm Fri 7:00 am -11:30 AM Sat-Sun 7:00 am-11:30 am

## 2018-10-30 NOTE — Progress Notes (Signed)
Discharge instructions given to pt and all questions were answered. Pt taken down via wheelchair and was picked up by her mom. 

## 2018-10-30 NOTE — Discharge Instructions (Signed)
Soak buttocks multiple times daily Once the piece of tissue between your buttocks has softened you need to try and wipe off with tissue Take antibiotics to completion Follow up with primary care physician Call your original surgeon if you have any further complications   How to Take a Sitz Bath A sitz bath is a warm water bath that may be used to care for your rectum, genital area, or the area between your rectum and genitals (perineum). For a sitz bath, the water only comes up to your hips and covers your buttocks. A sitz bath may done at home in a bathtub or with a portable sitz bath that fits over the toilet. Your health care provider may recommend a sitz bath to help:  Relieve pain and discomfort after delivering a baby.  Relieve pain and itching from hemorrhoids or anal fissures.  Relieve pain after certain surgeries.  Relax muscles that are sore or tight. How to take a sitz bath Take 3-4 sitz baths a day, or as many as told by your health care provider. Bathtub sitz bath To take a sitz bath in a bathtub: 1. Partially fill a bathtub with warm water. The water should be deep enough to cover your hips and buttocks when you are sitting in the tub. 2. If your health care provider told you to put medicine in the water, follow his or her instructions. 3. Sit in the water. 4. Open the tub drain a little, and leave it open during your bath. 5. Turn on the warm water again, enough to replace the water that is draining out. Keep the water running throughout your bath. This helps keep the water at the right level and the right temperature. 6. Soak in the water for 15-20 minutes, or as long as told by your health care provider. 7. When you are done, be careful when you stand up. You may feel dizzy. 8. After the sitz bath, pat yourself dry. Do not rub your skin to dry it.  Over-the-toilet sitz bath To take a sitz bath with an over-the-toilet basin: 1. Follow the manufacturer's  instructions. 2. Fill the basin with warm water. 3. If your health care provider told you to put medicine in the water, follow his or her instructions. 4. Sit on the seat. Make sure the water covers your buttocks and perineum. 5. Soak in the water for 15-20 minutes, or as long as told by your health care provider. 6. After the sitz bath, pat yourself dry. Do not rub your skin to dry it. 7. Clean and dry the basin between uses. 8. Discard the basin if it cracks, or according to the manufacturer's instructions. Contact a health care provider if:  Your symptoms get worse. Do not continue with sitz baths if your symptoms get worse.  You have new symptoms. If this happens, do not continue with sitz baths until you talk with your health care provider. Summary  A sitz bath is a warm water bath in which the water only comes up to your hips and covers your buttocks.  A sitz bath may help relieve itching, relieve pain, and relax muscles that are sore or tight in the lower part of your body, including your genital area.  Take 3-4 sitz baths a day, or as many as told by your health care provider. Soak in the water for 15-20 minutes.  Do not continue with sitz baths if your symptoms get worse. This information is not intended to replace advice given  to you by your health care provider. Make sure you discuss any questions you have with your health care provider. Document Released: 06/09/2004 Document Revised: 09/19/2017 Document Reviewed: 09/19/2017 Elsevier Interactive Patient Education  2019 [employer].

## 2020-07-28 IMAGING — CT CT PELVIS W/ CM
2 of 3 series · 15 of 46 positions shown, 17 images · IV contrast (ISOVUE)
Comparison: None.

CLINICAL DATA: Recent plastic surgery on the buttocks 2 weeks ago
with buttock pain and rectal pain, initial encounter

EXAM:
CT PELVIS WITH CONTRAST
TECHNIQUE: Multidetector CT imaging of the pelvis was performed using the
standard protocol following the bolus administration of intravenous
contrast.
CONTRAST:  100mL [id number]

[Series 3: axial st · axial · 0.98mm/px · z∈[-690,-404]mm · 12 of 165 slices shown, 14 images]
[im 11/165  soft-tissue]
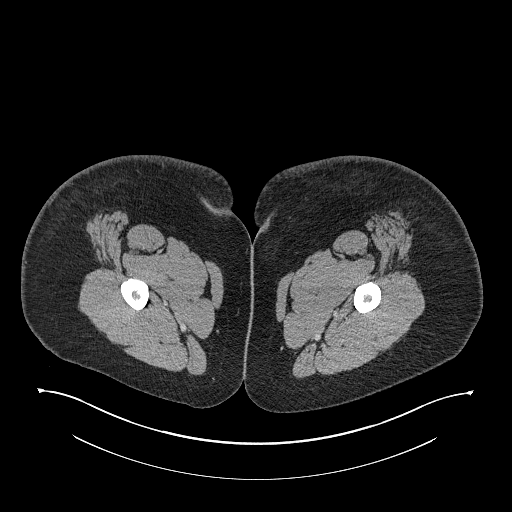
[im 11/165  bone]
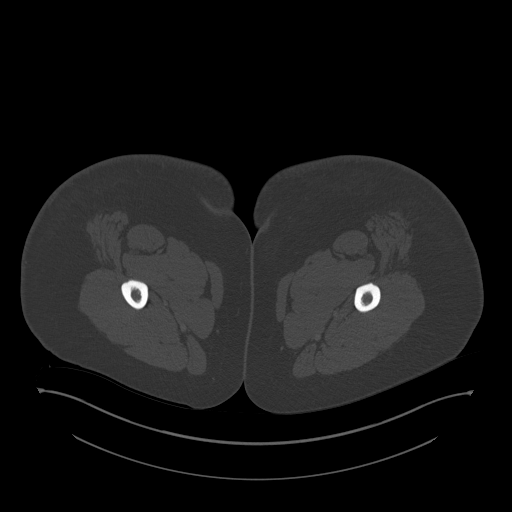
[im 22/165  soft-tissue]
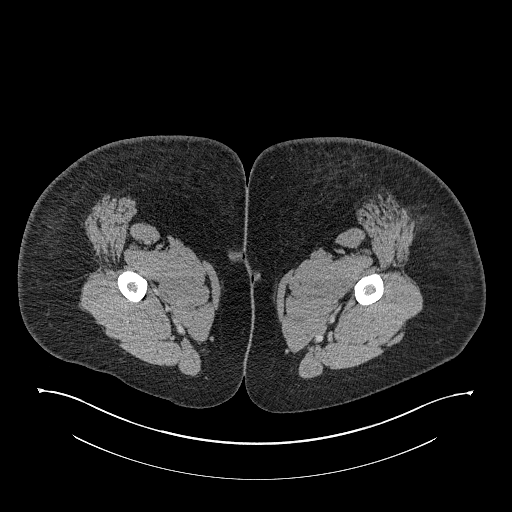
[im 38/165  soft-tissue]
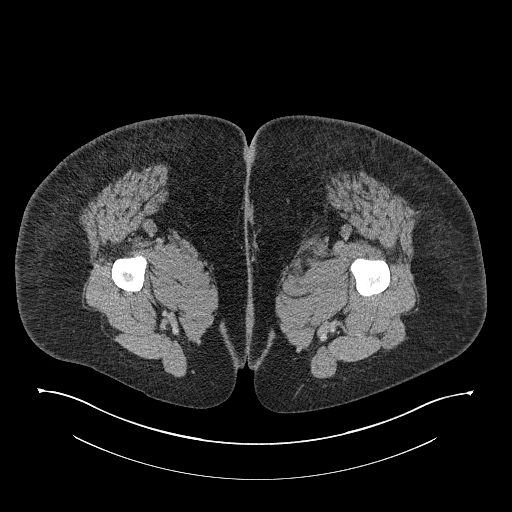
[im 48/165  soft-tissue]
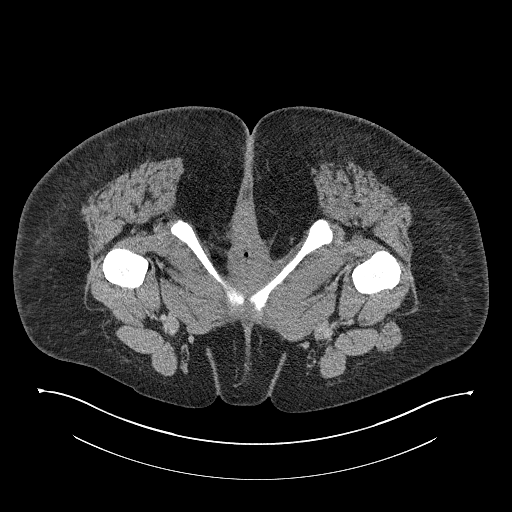
[im 64/165  soft-tissue]
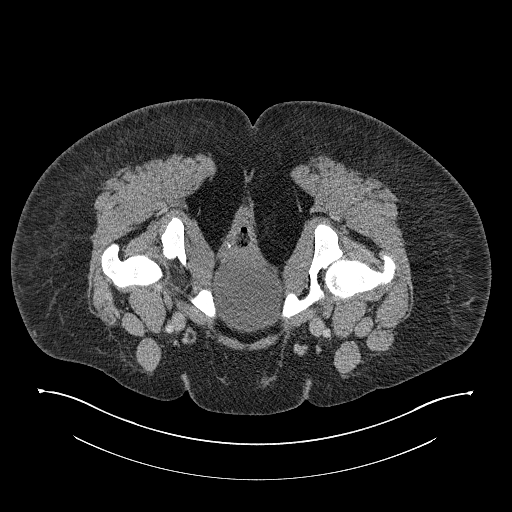
[im 75/165  soft-tissue]
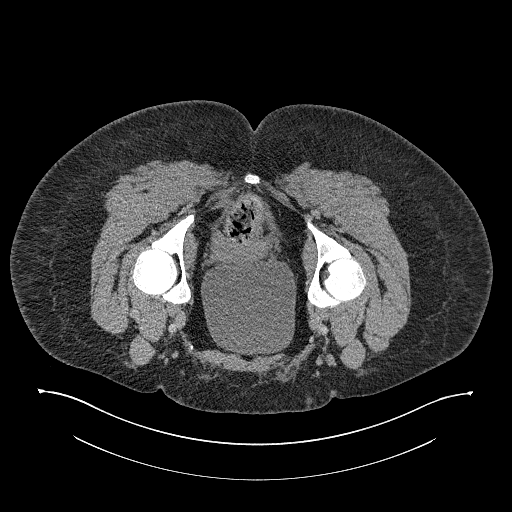
[im 90/165  soft-tissue]
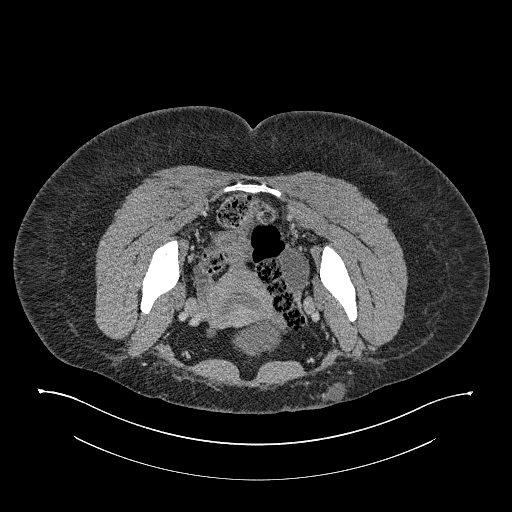
[im 101/165  soft-tissue]
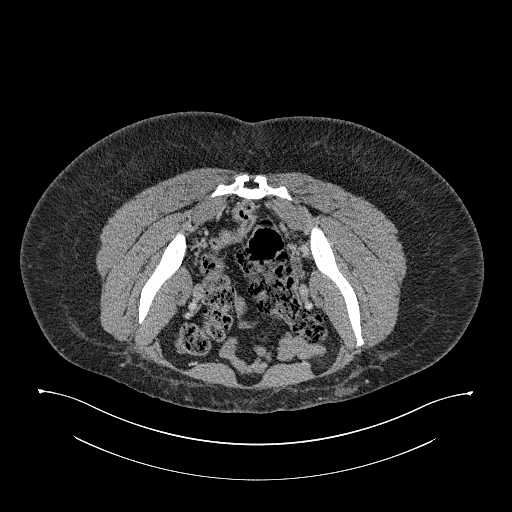
[im 117/165  soft-tissue]
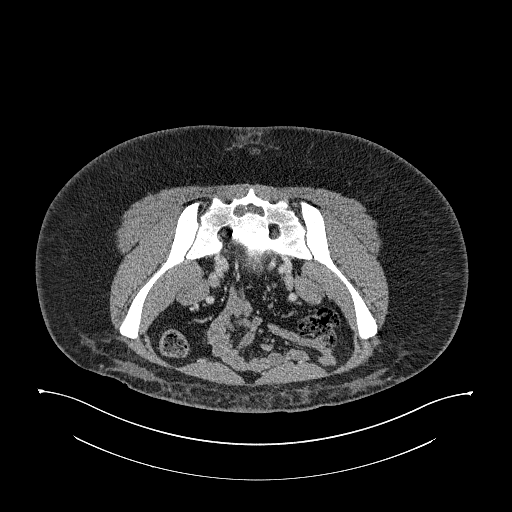
[im 117/165  bone]
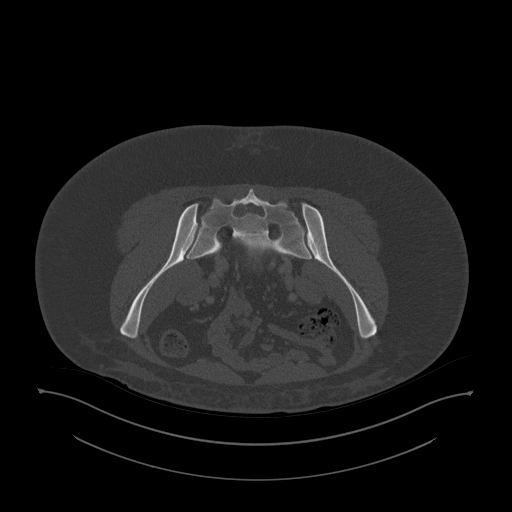
[im 127/165  soft-tissue]
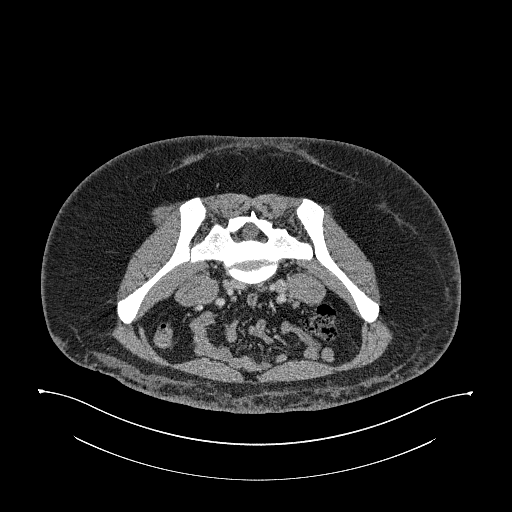
[im 143/165  soft-tissue]
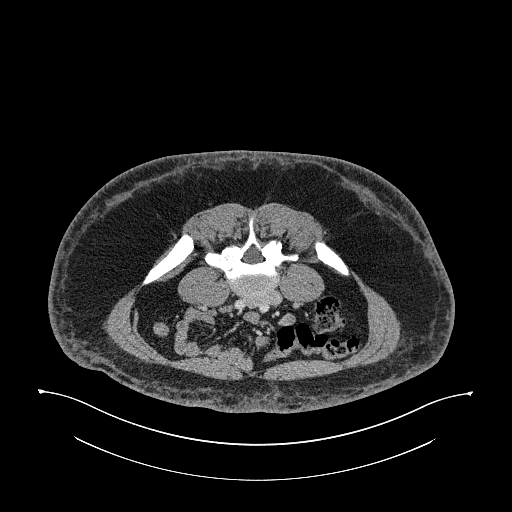
[im 154/165  soft-tissue]
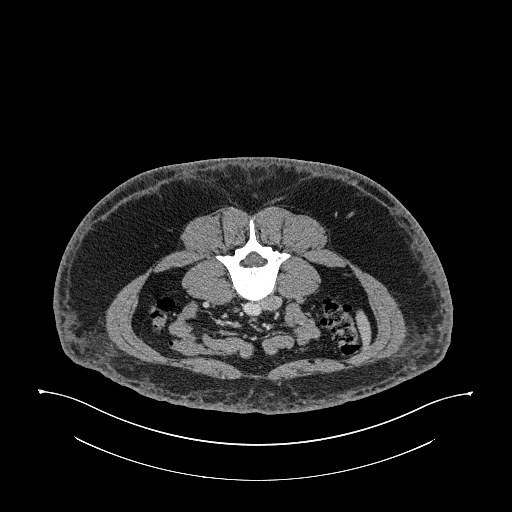

[Series 8: coronal st · coronal · 0.64mm/px · 3 of 157 slices shown]
[im 53/157  soft-tissue]
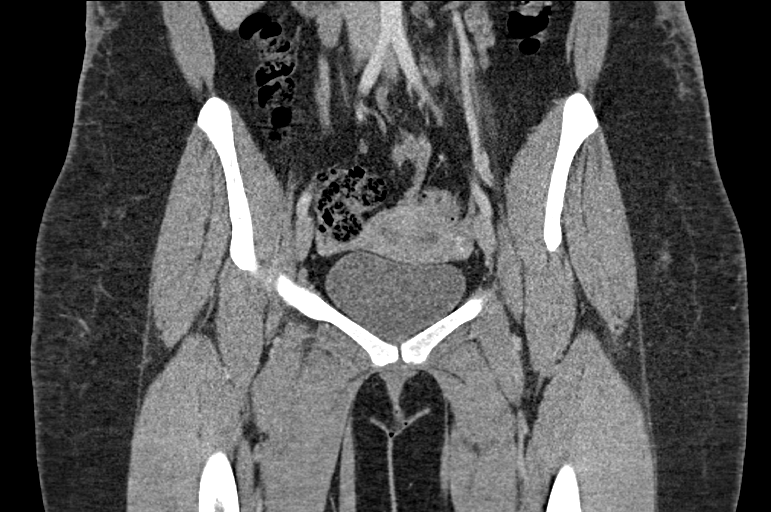
[im 70/157  soft-tissue]
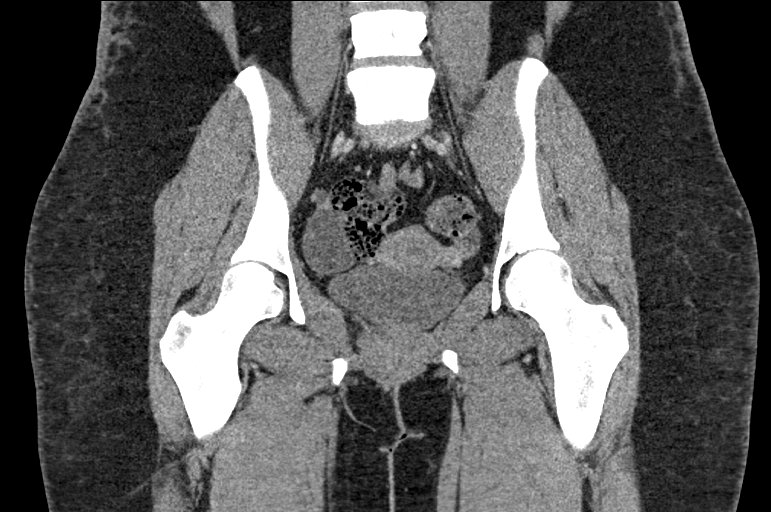
[im 87/157  soft-tissue]
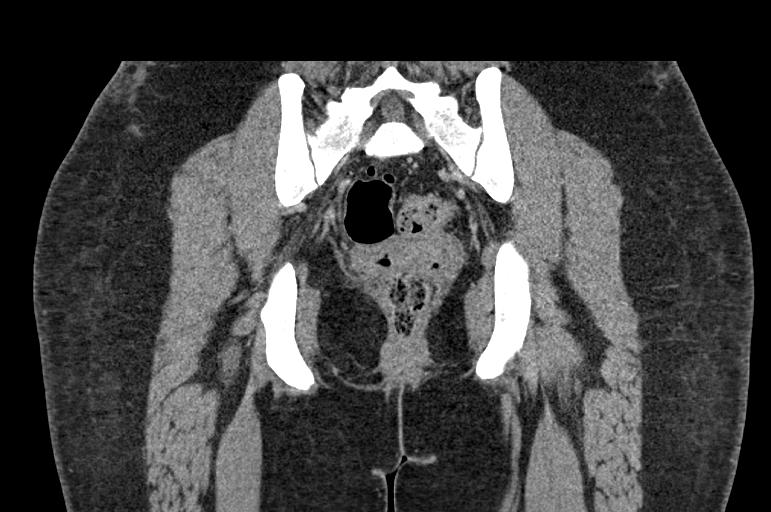

[15 of 46 positions shown; findings below may reference images not displayed]

FINDINGS: Urinary Tract:  Bladder is well distended.

Bowel: Visualized bowel is noted. No perirectal or perianal fluid
collection is identified. In the superior aspect of the intergluteal
cleft, there is a small focus of air and soft tissue density
identified. This is of uncertain significance but has the appearance
of fecal material. Direct visualization is recommended. This is best
visualized on the axial image 123 of series 3 as well as the coronal
images (128 of series 8) and the sagittal images (120 of series 9).

No associated fluid collection is seen.

Vascular/Lymphatic: No pathologically enlarged lymph nodes. No
significant vascular abnormality seen.

Reproductive: Uterus is within normal limits. Cystic changes are
noted in the ovaries bilaterally.

Other: No free fluid is seen. Considerable subcutaneous edema is
identified along the anterior aspect of the lower abdomen and pelvis
as well as laterally and extending posteriorly likely related to the
recent surgery. No focal fluid collection to suggest abscess is
identified.

Musculoskeletal: No bony abnormality is noted.
IMPRESSION: Changes consistent with the recent surgery with considerable
subcutaneous edema identified anteriorly and extending along the
lateral and posterior aspect of the abdominal and pelvic wall. No
definitive focal fluid collection to suggest abscess is seen.

Mottled density identified in the superior aspect of the
intergluteal cleft. This has the appearance of fecal material.
Direct visualization is recommended. No associated fluid collection
to suggest abscess is seen.

No other focal abnormality is noted.
